# Patient Record
Sex: Male | Born: 1965 | Race: White | Hispanic: No | Marital: Married | Smoking: Never smoker
Health system: Southern US, Community
[De-identification: ages and names within clinical notes are randomized; demographics above are authoritative.]

## PROBLEM LIST (undated history)

## (undated) DIAGNOSIS — M1990 Unspecified osteoarthritis, unspecified site: Secondary | ICD-10-CM

## (undated) DIAGNOSIS — I10 Essential (primary) hypertension: Secondary | ICD-10-CM

## (undated) DIAGNOSIS — Z86010 Personal history of colonic polyps: Principal | ICD-10-CM

## (undated) DIAGNOSIS — K219 Gastro-esophageal reflux disease without esophagitis: Secondary | ICD-10-CM

## (undated) DIAGNOSIS — T7840XA Allergy, unspecified, initial encounter: Secondary | ICD-10-CM

## (undated) DIAGNOSIS — R011 Cardiac murmur, unspecified: Secondary | ICD-10-CM

## (undated) HISTORY — DX: Cardiac murmur, unspecified: R01.1

## (undated) HISTORY — PX: POLYPECTOMY: SHX149

## (undated) HISTORY — DX: Unspecified osteoarthritis, unspecified site: M19.90

## (undated) HISTORY — PX: COLONOSCOPY: SHX174

## (undated) HISTORY — DX: Personal history of colonic polyps: Z86.010

## (undated) HISTORY — DX: Essential (primary) hypertension: I10

## (undated) HISTORY — DX: Allergy, unspecified, initial encounter: T78.40XA

## (undated) HISTORY — DX: Gastro-esophageal reflux disease without esophagitis: K21.9

---

## 2007-10-12 ENCOUNTER — Ambulatory Visit (HOSPITAL_COMMUNITY): Admission: RE | Admit: 2007-10-12 | Discharge: 2007-10-12 | Payer: Self-pay | Admitting: Internal Medicine

## 2008-06-13 ENCOUNTER — Ambulatory Visit (HOSPITAL_COMMUNITY): Admission: RE | Admit: 2008-06-13 | Discharge: 2008-06-13 | Payer: Self-pay | Admitting: Internal Medicine

## 2008-06-21 ENCOUNTER — Ambulatory Visit (HOSPITAL_COMMUNITY): Admission: RE | Admit: 2008-06-21 | Discharge: 2008-06-21 | Payer: Self-pay | Admitting: Sports Medicine

## 2009-11-26 ENCOUNTER — Encounter: Payer: Self-pay | Admitting: Cardiovascular Disease

## 2009-12-14 ENCOUNTER — Ambulatory Visit: Payer: Self-pay | Admitting: Cardiovascular Disease

## 2009-12-14 DIAGNOSIS — R079 Chest pain, unspecified: Secondary | ICD-10-CM

## 2009-12-14 DIAGNOSIS — R011 Cardiac murmur, unspecified: Secondary | ICD-10-CM

## 2010-07-04 IMAGING — CR DG TIBIA/FIBULA 2V*L*
3 series · 3 of 3 positions shown · non-contrast
Comparison: No priors

CLINICAL DATA: Bilateral leg pain - the patient is a runner.

LEFT TIBIA AND FIBULA - 2 VIEW

[view not recorded (1 of 3)]
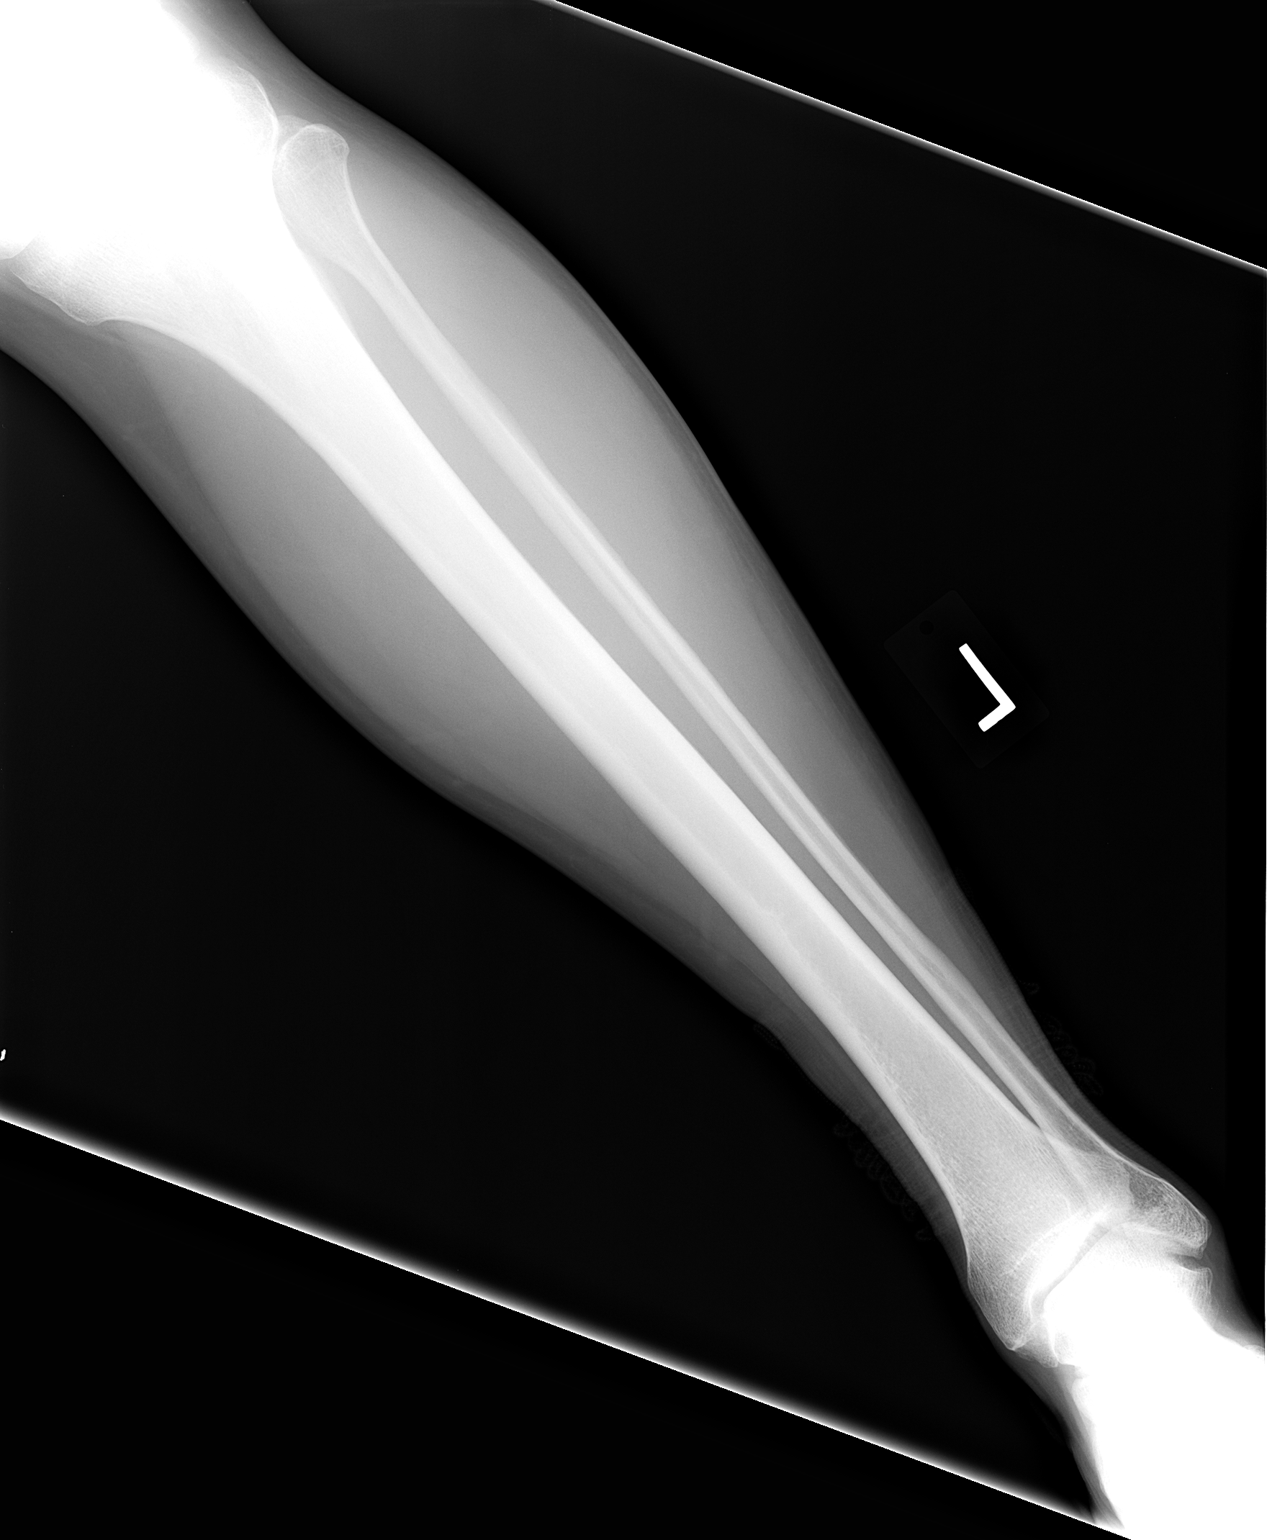

[view not recorded (2 of 3)]
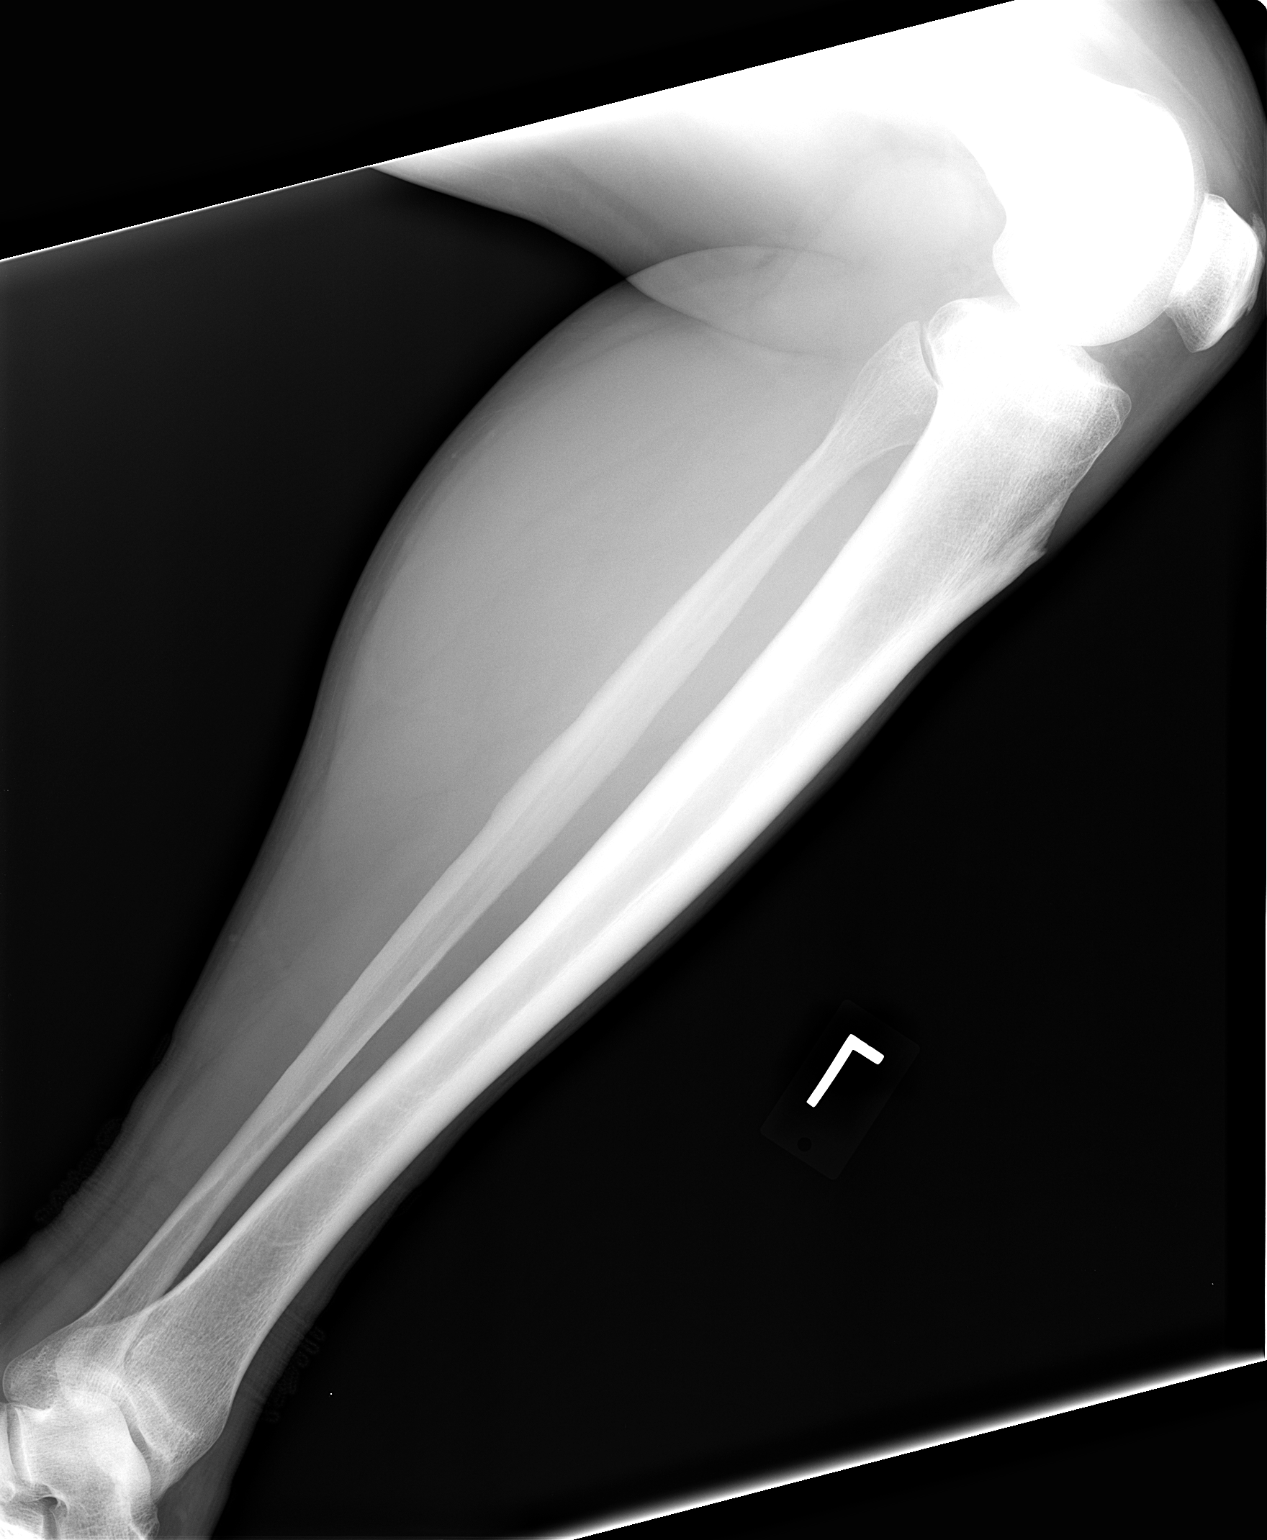

[view not recorded (3 of 3)]
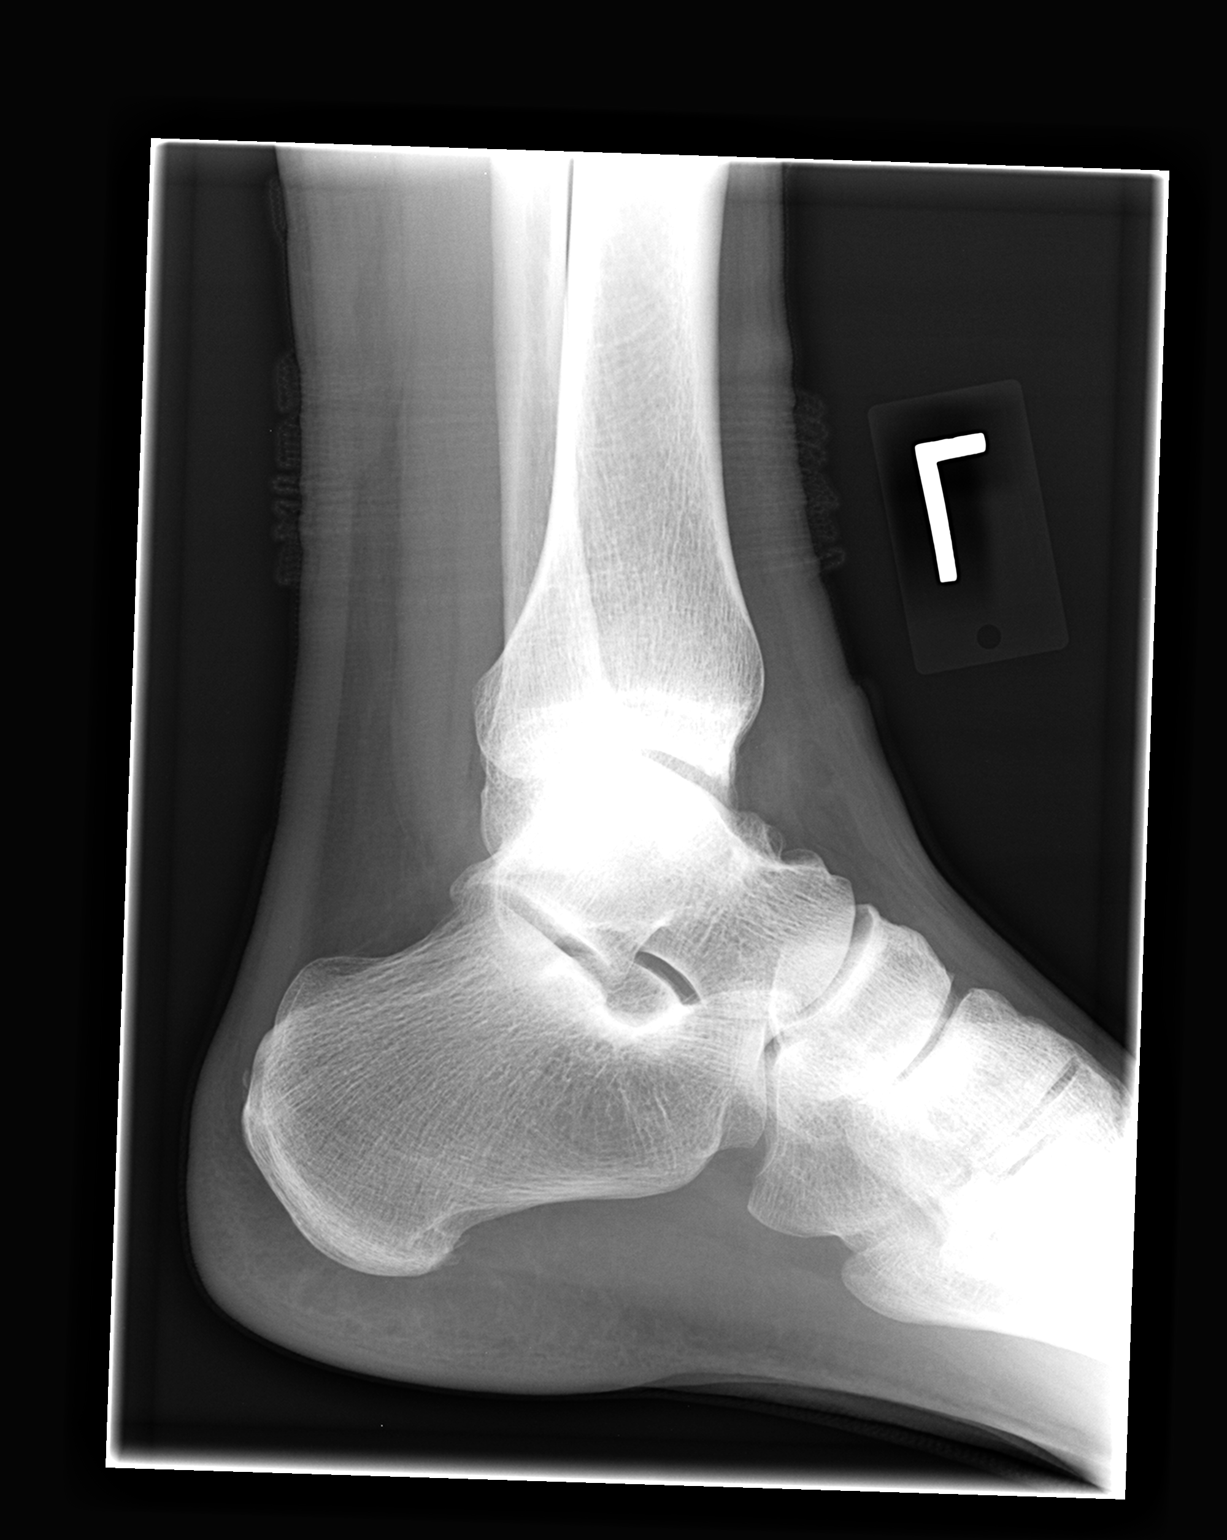

[3 of 3 positions shown; findings below may reference images not displayed]

FINDINGS: No visible fracture or stress fracture.  Soft tissues
unremarkable.
IMPRESSION: No acute or focal abnormality.

## 2010-07-12 IMAGING — NM NM BONE 3 PHASE
2 series · 12 of 12 positions shown · non-contrast
Comparison: Plain radiographs of the lower legs on 06/13/2008

CLINICAL DATA: Query bilateral stress fractures.  Shin splints.
Lower extremity pain.

NUCLEAR MEDICINE THREE PHASE BONE SCAN
TECHNIQUE: After intravenous administration of radiopharmeceutical,
immediate flow images were performed by immediate / early static
images and approximate three hour delayed static images.
Radiopharmaceutical: 25 mCi technetium MDP IV

[3 phase bone · 9.33mm/px · 6 of 48 frames shown (1 of 2)]
[frame 5/48]
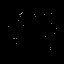
[frame 13/48]
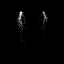
[frame 21/48  full-range]
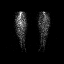
[frame 29/48  full-range]
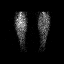
[frame 37/48  full-range]
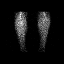
[frame 45/48  full-range]
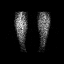

[3 phase bone · 9.33mm/px · 6 of 48 frames shown (2 of 2)]
[frame 5/48]
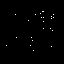
[frame 13/48]
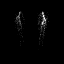
[frame 21/48  full-range]
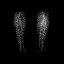
[frame 29/48  full-range]
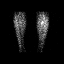
[frame 37/48  full-range]
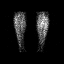
[frame 45/48  full-range]
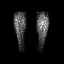

[12 of 12 positions shown; findings below may reference images not displayed]

FINDINGS: Bilateral increased peripheral tracer uptake involving
the tibia is somewhat more so involving the proximal aspects of the
tibias.  Findings are compatible with shin splints (periostitis).
IMPRESSION: Bilateral shin splints/tibial periostitis.

## 2010-08-06 NOTE — Progress Notes (Signed)
Summary: [facility] Adolescent Office Note  [facility] Adolescent Office Note   Imported By: Roderic Ovens 12/26/2009 14:51:45  _____________________________________________________________________  External Attachment:    Type:   Image     Comment:   External Document

## 2010-08-06 NOTE — Consult Note (Signed)
Summary: [facility]  [facility]   Imported By: Marylou Mccoy 12/12/2009 09:14:38  _____________________________________________________________________  External Attachment:    Type:   Image     Comment:   External Document

## 2010-08-06 NOTE — Assessment & Plan Note (Signed)
Summary: NP3/CHEST PAIN/JML   History of Present Illness: Duane Palmer is seen today at the request of Dr Elisabeth Most for atypical SSCP.  He was evaluated at the end of May with  normal ECG.  He has no previous diagnosis of CAD and has not had a previous ETT.  CRF's are negative except for family history.  His pain persists and is very atypical.  He points to multiple spots over the right and left chest, left shoulder,  and arm that have fleeting pain.  He indicated pain at the time of our visit.  He had been strength training up until about a month ago and taking some supplements including Creatine.  He denies steroid use.  The pains are not made worse with exercise.  He has had previous right rotator cuff injury but no other trauma, connective tissue disease or systemic disease.  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   Vital Signs:  Patient profile:   45 year old male Height:      69 inches Weight:      227 pounds BMI:     33.64 Pulse rate:   80 / minute Pulse rhythm:   regular BP sitting:   133 / 78  (left arm) Cuff size:   regular  Vitals Entered By: Judithe Modest CMA (December 14, 2009 3:07  PM)  Physical Exam  General:  Affect appropriate Healthy:  appears stated age HEENT: normal Neck supple with no adenopathy JVP normal no bruits no thyromegaly Lungs clear with no wheezing and good diaphragmatic motion Heart:  S1/S2 systolic murmur no rub, gallop or click PMI normal Abdomen: benighn, BS positve, no tenderness, no AAA no bruit.  No HSM or HJR Distal pulses intact with no bruits No edema Neuro non-focal Skin warm and dry    Impression & Recommendations:  Problem # 1:  CHEST PAIN UNSPECIFIED (ICD-786.50) Atypical normal ECG  F/U ETT Orders: Treadmill (Treadmill)  Problem # 2:  MURMUR (ICD-785.2) Assessment: Unchanged Benign systolic murmur.  Will do echo same day as ETT Orders: Echocardiogram (Echo)  Patient Instructions: 1)  Your physician recommends that you schedule a follow-up appointment in: AS NEEDED 2)  Your physician recommends that you continue on your current medications as directed. Please refer to the Current Medication list given to you today. 3)  Your physician has requested that you have an echocardiogram.  Echocardiography is a painless test that uses sound waves to create images of your heart. It provides your doctor with information about the size and shape of your heart and how well your heart's chambers and valves are working.  This procedure takes approximately one hour. There are no restrictions for this procedure.GXT SAME DAY 4)  Your physician has requested that you have an exercise tolerance test.  For further information please  visit [email or web address]  Please also follow instruction sheet, as given.   EKG Report  Procedure date:  11/26/2009  Findings:      NSR Normal ECG

## 2011-06-07 ENCOUNTER — Encounter: Payer: Self-pay | Admitting: *Deleted

## 2011-06-07 ENCOUNTER — Emergency Department (HOSPITAL_COMMUNITY)
Admission: EM | Admit: 2011-06-07 | Discharge: 2011-06-07 | Disposition: A | Discharge status: Home / Self Care | Payer: 59 | Attending: Emergency Medicine | Admitting: Emergency Medicine

## 2011-06-07 DIAGNOSIS — R55 Syncope and collapse: Secondary | ICD-10-CM | POA: Insufficient documentation

## 2011-06-07 DIAGNOSIS — E860 Dehydration: Secondary | ICD-10-CM | POA: Insufficient documentation

## 2011-06-07 LAB — BASIC METABOLIC PANEL
Calcium: 9.5 mg/dL (ref 8.4–10.5)
Chloride: 102 mEq/L (ref 96–112)
Creatinine, Ser: 1.11 mg/dL (ref 0.50–1.35)
GFR calc Af Amer: >90 mL/min (ref >90)

## 2011-06-07 LAB — GLUCOSE, CAPILLARY: Glucose-Capillary: 105 mg/dL — ABNORMAL HIGH (ref 70–99)

## 2011-06-07 LAB — CBC
MCV: 93.3 fL (ref 78.0–100.0)
Platelets: 193 10*3/uL (ref 150–400)
RDW: 13 % (ref 11.5–15.5)
WBC: 8.7 10*3/uL (ref 4.0–10.5)

## 2011-06-07 MED ORDER — IBUPROFEN 800 MG PO TABS
800.0000 mg | ORAL_TABLET | Freq: Once | ORAL | Status: AC
  Administered 2011-06-07: 800 mg via ORAL
  Filled 2011-06-07: qty 1

## 2011-06-07 MED ORDER — SODIUM CHLORIDE 0.9 % IV BOLUS (SEPSIS)
1000.0000 mL | Freq: Once | INTRAVENOUS | Status: AC
  Administered 2011-06-07: 1000 mL via INTRAVENOUS

## 2011-06-07 MED ORDER — ONDANSETRON HCL 4 MG/2ML IJ SOLN
4.0000 mg | Freq: Once | INTRAMUSCULAR | Status: AC
  Administered 2011-06-07: 4 mg via INTRAVENOUS
  Filled 2011-06-07: qty 2

## 2011-06-07 NOTE — ED Notes (Signed)
Pt was outside in [place] for about 3 hours today, started to feel weak and shaky, presents pale and shaky in triage

## 2011-06-07 NOTE — ED Notes (Signed)
Pt requesting Ginger Ale which was given, okayed by PA, will monitor.

## 2011-06-07 NOTE — ED Provider Notes (Signed)
History     CSN: [id number] Arrival date & time: 06/07/2011  2:52 PM   First MD Initiated Contact with Patient 06/07/11 1613      Chief Complaint  Patient presents with  . Near Syncope    (Consider location/radiation/quality/duration/timing/severity/associated sxs/prior treatment) HPI History provided by pt.   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  Shortly after, he became anxious.  Did not have lightheadedness, dizziness, vision changes, paresthesias, chest pain.  Has had similar sx in the past and attributes to dehydration. Pt had not had anything to eat/drink since 8:30am.  No recent illnesses.  No PMH.  No h/o anxiety.    History reviewed. No pertinent past medical history.  History reviewed. No pertinent past surgical history.  No family history on file.  History  Substance Use Topics  . Smoking status: Never Smoker   . Smokeless tobacco: Not on file  . Alcohol Use: Yes     daily      Review of Systems  All other systems reviewed and are negative.    Allergies  Review of patient's allergies indicates no known allergies.  Home Medications   Current Outpatient Rx  Name Route Sig Dispense Refill  . BISMUTH SUBSALICYLATE 262 MG/15ML PO SUSP Oral Take 30 mLs by mouth every 6 (six) hours as needed. Upset stomach     . THERA M PLUS PO TABS Oral Take 1 tablet by mouth daily.      Marland Kitchen NICOTINE POLACRILEX 2 MG MT GUM Oral Take 2 mg by mouth as needed. Chews 8 pieces of gum daily       BP 125/77  Temp 97.9 F (36.6 C)  Resp 31  SpO2 100%  Physical Exam  Nursing note and vitals reviewed. Constitutional: He is oriented to person, place, and time. He appears well-developed and well-nourished. No distress.  HENT:  Head: Normocephalic and atraumatic.       Face flushed. Mucous membranes dry.  Eyes:       Normal appearance  Neck: Normal range of motion.  Cardiovascular: Normal  rate and regular rhythm.   Pulmonary/Chest: Effort normal and breath sounds normal. He exhibits no tenderness.  Abdominal: Soft. Bowel sounds are normal. He exhibits no distension. There is no tenderness.  Musculoskeletal: He exhibits no edema and no tenderness.  Neurological: He is alert and oriented to person, place, and time.  Skin: Skin is warm and dry. No rash noted.  Psychiatric: He has a normal mood and affect. His behavior is normal.    ED Course  Procedures (including critical care time)   Date: 06/07/2011  Rate: 82  Rhythm: normal sinus rhythm  QRS Axis: normal  Intervals: normal  ST/T Wave abnormalities: nonspecific T wave changes  Conduction Disutrbances:none  Narrative Interpretation:   Old EKG Reviewed: none available   Labs Reviewed  BASIC METABOLIC PANEL - Abnormal; Notable for the following:    Glucose, Bld 121 (*)    GFR calc non Af Amer 79 (*)    All other components within normal limits  GLUCOSE, CAPILLARY - Abnormal; Notable for the following:    Glucose-Capillary 105 (*)    All other components within normal limits  CBC  POCT CBG MONITORING   No results found.   1. Dehydration       MDM  Pt believes that he is dehydrated.  Worked outside for several hrs today and had little to  eat/drink.  Had an episode of chills, clamminess, tremors, nausea, palpitations and RR.  Vomited twice in ED.  No h/o anxiety.  In triage, pale, shaky, tachypneic and HR 95bpm.  My exam sig for dry mucous membranes and elevated HR.  EKG and labs unremarkable.  Pt received 2L bolus and 800mg  ibuprofen and reports that he is feeling better and well enough to go home.  Has a PCP to f/u with.  Recommended rest and hydration.  Return precautions discussed.         Otilio Miu, [place] 06/07/11 2030

## 2011-06-07 NOTE — ED Notes (Signed)
WUJ:WJ19<JY> Expected date:06/07/11<BR> Expected time: 3:57 PM<BR> Means of arrival:Ambulance<BR> Comments:<BR> EMS 90 GC, 78 yom swelling to neck No airway compromise

## 2011-06-08 NOTE — ED Provider Notes (Signed)
Medical screening examination/treatment/procedure(s) were performed by non-physician practitioner and as supervising physician I was immediately available for consultation/collaboration.  Celene Kras, MD 06/08/11 [phone]

## 2013-12-22 ENCOUNTER — Ambulatory Visit: Payer: Self-pay | Admitting: Emergency Medicine

## 2016-11-26 ENCOUNTER — Telehealth: Payer: Self-pay

## 2016-11-26 ENCOUNTER — Encounter: Payer: Self-pay | Admitting: Internal Medicine

## 2016-11-26 NOTE — Telephone Encounter (Signed)
Left message for patient to call back  

## 2016-11-26 NOTE — Telephone Encounter (Signed)
-----   Message from Gatha Mayer, MD sent at 11/25/2016  4:53 PM EDT ----- Regarding: needs previsit and colonoscopy Goes by Duane Palmer [facility] [employer]  Asked me to arrange colonoscopy   Would you contact him and set up?  Screening  Thanks  Cell is [phone]

## 2016-11-28 NOTE — Telephone Encounter (Signed)
Left message for patient to call back  

## 2016-12-03 NOTE — Telephone Encounter (Signed)
No return call from the patient.  I will mail him a letter

## 2017-01-08 ENCOUNTER — Ambulatory Visit (AMBULATORY_SURGERY_CENTER): Payer: Self-pay

## 2017-01-08 VITALS — Ht 70.0 in | Wt 180.0 lb

## 2017-01-08 DIAGNOSIS — Z1211 Encounter for screening for malignant neoplasm of colon: Secondary | ICD-10-CM

## 2017-01-08 NOTE — Progress Notes (Signed)
No allergies to eggs or soy No diet meds No home oxygen No past problems with anesthesia  Declined emmi 

## 2017-01-12 ENCOUNTER — Encounter: Payer: Self-pay | Admitting: Internal Medicine

## 2017-01-21 ENCOUNTER — Ambulatory Visit (AMBULATORY_SURGERY_CENTER): Payer: 59 | Admitting: Internal Medicine

## 2017-01-21 ENCOUNTER — Encounter: Payer: Self-pay | Admitting: Internal Medicine

## 2017-01-21 VITALS — BP 114/75 | HR 46 | Temp 98.4°F | Resp 14 | Ht 70.0 in | Wt 180.0 lb

## 2017-01-21 DIAGNOSIS — Z1212 Encounter for screening for malignant neoplasm of rectum: Secondary | ICD-10-CM | POA: Diagnosis not present

## 2017-01-21 DIAGNOSIS — Z1211 Encounter for screening for malignant neoplasm of colon: Secondary | ICD-10-CM | POA: Diagnosis present

## 2017-01-21 DIAGNOSIS — D123 Benign neoplasm of transverse colon: Secondary | ICD-10-CM | POA: Diagnosis not present

## 2017-01-21 DIAGNOSIS — D124 Benign neoplasm of descending colon: Secondary | ICD-10-CM

## 2017-01-21 MED ORDER — SODIUM CHLORIDE 0.9 % IV SOLN: 500.0000 mL | INTRAVENOUS | Status: AC

## 2017-01-21 NOTE — Progress Notes (Signed)
A/ox3 pleased with MAC, report to [facility]

## 2017-01-21 NOTE — Op Note (Signed)
[place] Patient Name: Duane Palmer Procedure Date: 01/21/2017 11:27 AM MRN: [id number] Endoscopist: Gatha Mayer , MD Age: 51 Referring MD:  Date of Birth: 05-20-1966 Gender: Male Account #: [id number] Procedure:                Colonoscopy Indications:              Screening for colorectal malignant neoplasm, This                            is the patient's first colonoscopy Medicines:                Monitored Anesthesia Care Procedure:                Pre-Anesthesia Assessment:                           - Prior to the procedure, a History and Physical                            was performed, and patient medications and                            allergies were reviewed. The patient's tolerance of                            previous anesthesia was also reviewed. The risks                            and benefits of the procedure and the sedation                            options and risks were discussed with the patient.                            All questions were answered, and informed consent                            was obtained. Prior Anticoagulants: The patient has                            taken no previous anticoagulant or antiplatelet                            agents. ASA Grade Assessment: II - A patient with                            mild systemic disease. After reviewing the risks                            and benefits, the patient was deemed in                            satisfactory condition to undergo the procedure.  After obtaining informed consent, the colonoscope                            was passed under direct vision. Throughout the                            procedure, the patient's blood pressure, pulse, and                            oxygen saturations were monitored continuously. The                            Colonoscope was introduced through the anus and                            advanced to the the cecum,  identified by                            appendiceal orifice and ileocecal valve. The                            colonoscopy was performed without difficulty. The                            patient tolerated the procedure well. The quality                            of the bowel preparation was excellent. The                            ileocecal valve, appendiceal orifice, and rectum                            were photographed. The bowel preparation used was                            Miralax. Scope In: 11:36:04 AM Scope Out: 12:01:42 PM Scope Withdrawal Time: 0 hours 23 minutes 8 seconds  Total Procedure Duration: 0 hours 25 minutes 38 seconds  Findings:                 The perianal and digital rectal examinations were                            normal. Pertinent negatives include normal prostate                            (size, shape, and consistency).                           Four sessile polyps were found in the descending                            colon and transverse colon. The polyps were  diminutive in size. These polyps were removed with                            a cold snare. Resection and retrieval were                            complete. Verification of patient identification                            for the specimen was done. Estimated blood loss was                            minimal.                           A diffuse area of severe melanosis was found in the                            entire colon.                           The exam was otherwise without abnormality on                            direct and retroflexion views. Complications:            No immediate complications. Estimated Blood Loss:     Estimated blood loss was minimal. Impression:               - Four diminutive polyps in the descending colon                            and in the transverse colon, removed with a cold                            snare. Resected and  retrieved.                           - Melanosis in the colon.                           - The examination was otherwise normal on direct                            and retroflexion views. Recommendation:           - Patient has a contact number available for                            emergencies. The signs and symptoms of potential                            delayed complications were discussed with the                            patient. Return to normal activities tomorrow.  Written discharge instructions were provided to the                            patient.                           - Resume previous diet.                           - Continue present medications.                           - Repeat colonoscopy is recommended. The                            colonoscopy date will be determined after pathology                            results from today's exam become available for                            review. Gatha Mayer, MD 01/21/2017 12:09:10 PM This report has been signed electronically.

## 2017-01-21 NOTE — Progress Notes (Signed)
Called to room to assist during endoscopic procedure.  Patient ID and intended procedure confirmed with present staff. Received instructions for my participation in the procedure from the performing physician.  

## 2017-01-21 NOTE — Patient Instructions (Addendum)
I found and removed 4 diminutive polyps - all look benign.  I will let you know pathology results and when to have another routine colonoscopy by mail and/or My Chart.  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 Not a problem.  I appreciate the opportunity to care for you. Gatha Mayer, MD, FACG     YOU HAD AN ENDOSCOPIC PROCEDURE TODAY AT [facility]:   Refer to the procedure report that was given to you for any specific questions about what was found during the examination.  If the procedure report does not answer your questions, please call your gastroenterologist to clarify.  If you requested that your care partner not be given the details of your procedure findings, then the procedure report has been included in a sealed envelope for you to review at your convenience later.  YOU SHOULD EXPECT: Some feelings of bloating in the abdomen. Passage of more gas than usual.  Walking can help get rid of the air that was put into your GI tract during the procedure and reduce the bloating. If you had a lower endoscopy (such as a colonoscopy or flexible sigmoidoscopy) you may notice spotting of blood in your stool or on the toilet paper. If you underwent a bowel prep for your procedure, you may not have a normal bowel movement for a few days.  Please Note:  You might notice some irritation and congestion in your nose or some drainage.  This is from the oxygen used during your procedure.  There is no need for concern and it should clear up in a day or so.  SYMPTOMS TO REPORT IMMEDIATELY:   Following lower endoscopy (colonoscopy or flexible sigmoidoscopy):  Excessive amounts of blood in the stool  Significant tenderness or worsening of abdominal pains  Swelling of the abdomen that is new, acute  Fever of 100F or higher   For urgent or emergent issues, a gastroenterologist can be reached at any hour by calling [phone].   DIET:  We do  recommend a small meal at first, but then you may proceed to your regular diet.  Drink plenty of fluids but you should avoid alcoholic beverages for 24 hours.  ACTIVITY:  You should plan to take it easy for the rest of today and you should NOT DRIVE or use heavy machinery until tomorrow (because of the sedation medicines used during the test).    FOLLOW UP: Our staff will call the number listed on your records the next business day following your procedure to check on you and address any questions or concerns that you may have regarding the information given to you following your procedure. If we do not reach you, we will leave a message.  However, if you are feeling well and you are not experiencing any problems, there is no need to return our call.  We will assume that you have returned to your regular daily activities without incident.  If any biopsies were taken you will be contacted by phone or by letter within the next 1-3 weeks.  Please call us at [phone] if you have not heard about the biopsies in 3 weeks.    SIGNATURES/CONFIDENTIALITY: You and/or your care partner have signed paperwork which will be entered into your electronic medical record.  These signatures attest to the fact that that the information above on your After Visit Summary has been reviewed and is understood.  Full responsibility of the confidentiality  of this discharge information lies with you and/or your care-partner.   Resume medications. Information given on polyps.

## 2017-01-22 ENCOUNTER — Telehealth: Payer: Self-pay

## 2017-01-22 ENCOUNTER — Telehealth: Payer: Self-pay | Admitting: *Deleted

## 2017-01-22 NOTE — Telephone Encounter (Signed)
  Follow up Call-  Call back number 01/21/2017  Post procedure Call Back phone  # [phone]  Permission to leave phone message Yes  Some recent data might be hidden    Spoke with wife Patient questions:  Do you have a fever, pain , or abdominal swelling? No. Pain Score  0 *  Have you tolerated food without any problems? Yes.    Have you been able to return to your normal activities? Yes.    Do you have any questions about your discharge instructions: Diet   No. Medications  No. Follow up visit  No.  Do you have questions or concerns about your Care? No.  Actions: * If pain score is 4 or above: No action needed, pain <4.

## 2017-01-22 NOTE — Telephone Encounter (Signed)
Follow up call unable to go through.  Unable to LM.  Will try to reach pt. Later today.

## 2017-01-28 ENCOUNTER — Encounter: Payer: Self-pay | Admitting: Internal Medicine

## 2017-01-28 DIAGNOSIS — Z860101 Personal history of adenomatous and serrated colon polyps: Secondary | ICD-10-CM | POA: Insufficient documentation

## 2017-01-28 DIAGNOSIS — Z86010 Personal history of colonic polyps: Secondary | ICD-10-CM

## 2017-01-28 HISTORY — DX: Personal history of colonic polyps: Z86.010

## 2017-01-28 NOTE — Progress Notes (Signed)
4 diminutive adenomas Recall 2021

## 2020-03-22 ENCOUNTER — Other Ambulatory Visit: Payer: Self-pay

## 2020-04-25 ENCOUNTER — Encounter: Payer: 59 | Admitting: Internal Medicine

## 2020-05-28 ENCOUNTER — Encounter: Payer: Self-pay | Admitting: Internal Medicine

## 2020-05-28 ENCOUNTER — Other Ambulatory Visit: Payer: Self-pay

## 2020-05-28 ENCOUNTER — Ambulatory Visit (AMBULATORY_SURGERY_CENTER): Payer: 59 | Admitting: *Deleted

## 2020-05-28 VITALS — Ht 69.0 in | Wt 190.0 lb

## 2020-05-28 DIAGNOSIS — Z86010 Personal history of colonic polyps: Secondary | ICD-10-CM

## 2020-05-28 NOTE — Progress Notes (Signed)
Pt verified name, DOB, address and insurance during PV today. Pt mailed instruction packet to included paper to complete and mail back to [facility] with addressed and stamped envelope, Emmi video, copy of consent form to read and not return, and instructions.. PV completed over the phone. Pt encouraged to call with questions or issues   Fully vax'd  No egg or soy allergy known to patient  No issues with past sedation with any surgeries or procedures No past  intubation problems   No FH of Malignant Hyperthermia + diet pills per patient- takes phentermine- instructed off x 10 days starting 11-22 Monday  No home 02 use per patient  No blood thinners per patient  Pt denies issues with constipation  No A fib or A flutter  EMMI video to pt or via [place] 19 guidelines implemented in PV today with Pt and RN    Due to the COVID-19 pandemic we are asking patients to follow these guidelines. Please only bring one care partner. Please be aware that your care partner may wait in the car in the parking lot or if they feel like they will be too hot to wait in the car, they may wait in the lobby on the 4th floor. All care partners are required to wear a mask the entire time (we do not have any that we can provide them), they need to practice social distancing, and we will do a Covid check for all patient's and care partners when you arrive. Also we will check their temperature and your temperature. If the care partner waits in their car they need to stay in the parking lot the entire time and we will call them on their cell phone when the patient is ready for discharge so they can bring the car to the front of the building. Also all patient's will need to wear a mask into building.

## 2020-06-07 ENCOUNTER — Ambulatory Visit: Payer: 59 | Admitting: Internal Medicine

## 2020-06-07 ENCOUNTER — Encounter: Payer: Self-pay | Admitting: Internal Medicine

## 2020-06-07 ENCOUNTER — Other Ambulatory Visit: Payer: Self-pay

## 2020-06-07 VITALS — BP 91/48 | HR 56 | Temp 96.9°F | Resp 16 | Ht 70.0 in | Wt 189.0 lb

## 2020-06-07 DIAGNOSIS — Z86010 Personal history of colonic polyps: Secondary | ICD-10-CM

## 2020-06-07 DIAGNOSIS — D123 Benign neoplasm of transverse colon: Secondary | ICD-10-CM

## 2020-06-07 DIAGNOSIS — K6389 Other specified diseases of intestine: Secondary | ICD-10-CM

## 2020-06-07 MED ORDER — SODIUM CHLORIDE 0.9 % IV SOLN: 500.0000 mL | Freq: Once | INTRAVENOUS | Status: DC

## 2020-06-07 NOTE — Progress Notes (Signed)
Called to room to assist during endoscopic procedure.  Patient ID and intended procedure confirmed with present staff. Received instructions for my participation in the procedure from the performing physician.  

## 2020-06-07 NOTE — Op Note (Signed)
[place] Patient Name: Emmitte Surgeon Procedure Date: 06/07/2020 3:26 PM MRN: [id number] Endoscopist: Gatha Mayer , MD Age: 54 Referring MD:  Date of Birth: October 16, 1965 Gender: Male Account #: [id number] Procedure:                Colonoscopy Indications:              Surveillance: Personal history of adenomatous                            polyps on last colonoscopy 3 years ago Medicines:                Propofol per Anesthesia, Monitored Anesthesia Care Procedure:                Pre-Anesthesia Assessment:                           - Prior to the procedure, a History and Physical                            was performed, and patient medications and                            allergies were reviewed. The patient's tolerance of                            previous anesthesia was also reviewed. The risks                            and benefits of the procedure and the sedation                            options and risks were discussed with the patient.                            All questions were answered, and informed consent                            was obtained. Prior Anticoagulants: The patient has                            taken no previous anticoagulant or antiplatelet                            agents. ASA Grade Assessment: II - A patient with                            mild systemic disease. After reviewing the risks                            and benefits, the patient was deemed in                            satisfactory condition to undergo the procedure.  After obtaining informed consent, the colonoscope                            was passed under direct vision. Throughout the                            procedure, the patient's blood pressure, pulse, and                            oxygen saturations were monitored continuously. The                            Colonoscope was introduced through the anus and                             advanced to the the cecum, identified by                            appendiceal orifice and ileocecal valve. The                            colonoscopy was somewhat difficult due to                            significant looping. Successful completion of the                            procedure was aided by applying abdominal pressure.                            The patient tolerated the procedure well. The                            quality of the bowel preparation was good. The                            bowel preparation used was Miralax via split dose                            instruction. The ileocecal valve, appendiceal                            orifice, and rectum were photographed. Scope In: 3:38:41 PM Scope Out: 3:59:56 PM Scope Withdrawal Time: 0 hours 17 minutes 24 seconds  Total Procedure Duration: 0 hours 21 minutes 15 seconds  Findings:                 The perianal and digital rectal examinations were                            normal. Pertinent negatives include normal prostate                            (size, shape, and consistency).  A 1 to 2 mm polyp was found in the transverse                            colon. The polyp was sessile. The polyp was removed                            with a cold biopsy forceps. Resection and retrieval                            were complete. Verification of patient                            identification for the specimen was done. Estimated                            blood loss was minimal.                           A patchy area of mildly melanotic mucosa was found                            in the entire colon.                           The exam was otherwise without abnormality on                            direct and retroflexion views. Complications:            No immediate complications. Estimated Blood Loss:     Estimated blood loss was minimal. Impression:               - One 1 to 2 mm polyp in the  transverse colon,                            removed with a cold biopsy forceps. Resected and                            retrieved.                           - Melanotic mucosa in the entire examined colon.                           - The examination was otherwise normal on direct                            and retroflexion views.                           - Personal history of colonic polyps. 4 diminutive                            adenomas 2018 Recommendation:           - Patient  has a contact number available for                            emergencies. The signs and symptoms of potential                            delayed complications were discussed with the                            patient. Return to normal activities tomorrow.                            Written discharge instructions were provided to the                            patient.                           - Continue present medications.                           - Repeat colonoscopy is recommended for                            surveillance. The colonoscopy date will be                            determined after pathology results from today's                            exam become available for review.                           - Resume previous diet. Gatha Mayer, MD 06/07/2020 4:08:00 PM This report has been signed electronically.

## 2020-06-07 NOTE — Patient Instructions (Signed)
YOU HAD AN ENDOSCOPIC PROCEDURE TODAY AT THE [facility]:   Refer to the procedure report that was given to you for any specific questions about what was found during the examination.  If the procedure report does not answer your questions, please call your gastroenterologist to clarify.  If you requested that your care partner not be given the details of your procedure findings, then the procedure report has been included in a sealed envelope for you to review at your convenience later.  YOU SHOULD EXPECT: Some feelings of bloating in the abdomen. Passage of more gas than usual.  Walking can help get rid of the air that was put into your GI tract during the procedure and reduce the bloating. If you had a lower endoscopy (such as a colonoscopy or flexible sigmoidoscopy) you may notice spotting of blood in your stool or on the toilet paper. If you underwent a bowel prep for your procedure, you may not have a normal bowel movement for a few days.  Please Note:  You might notice some irritation and congestion in your nose or some drainage.  This is from the oxygen used during your procedure.  There is no need for concern and it should clear up in a day or so.  SYMPTOMS TO REPORT IMMEDIATELY:   Following lower endoscopy (colonoscopy or flexible sigmoidoscopy):  Excessive amounts of blood in the stool  Significant tenderness or worsening of abdominal pains  Swelling of the abdomen that is new, acute  Fever of 100F or higher  For urgent or emergent issues, a gastroenterologist can be reached at any hour by calling [phone]. Do not use MyChart messaging for urgent concerns.    DIET:  We do recommend a small meal at first, but then you may proceed to your regular diet.  Drink plenty of fluids but you should avoid alcoholic beverages for 24 hours.  ACTIVITY:  You should plan to take it easy for the rest of today and you should NOT DRIVE or use heavy machinery until tomorrow (because  of the sedation medicines used during the test).    FOLLOW UP: Our staff will call the number listed on your records 48-72 hours following your procedure to check on you and address any questions or concerns that you may have regarding the information given to you following your procedure. If we do not reach you, we will leave a message.  We will attempt to reach you two times.  During this call, we will ask if you have developed any symptoms of COVID 19. If you develop any symptoms (ie: fever, flu-like symptoms, shortness of breath, cough etc.) before then, please call [phone].  If you test positive for Covid 19 in the 2 weeks post procedure, please call and report this information to us.    If any biopsies were taken you will be contacted by phone or by letter within the next 1-3 weeks.  Please call us at [phone] if you have not heard about the biopsies in 3 weeks.    SIGNATURES/CONFIDENTIALITY: You and/or your care partner have signed paperwork which will be entered into your electronic medical record.  These signatures attest to the fact that that the information above on your After Visit Summary has been reviewed and is understood.  Full responsibility of the confidentiality of this discharge information lies with you and/or your care-partner. 

## 2020-06-07 NOTE — Progress Notes (Signed)
A/ox3, pleased with MAC, report to RN 

## 2020-06-11 ENCOUNTER — Telehealth: Payer: Self-pay | Admitting: *Deleted

## 2020-06-11 NOTE — Telephone Encounter (Signed)
  Follow up Call-  Call back number 06/07/2020  Post procedure Call Back phone  # [phone]  Permission to leave phone message Yes  Some recent data might be hidden     Patient questions:  Do you have a fever, pain , or abdominal swelling? No. Pain Score  0 *  Have you tolerated food without any problems? Yes.    Have you been able to return to your normal activities? Yes.    Do you have any questions about your discharge instructions: Diet   No. Medications  No. Follow up visit  No.  Do you have questions or concerns about your Care? No.  Actions: * If pain score is 4 or above: No action needed, pain <4.  1. Have you developed a fever since your procedure? no  2.   Have you had an respiratory symptoms (SOB or cough) since your procedure? no  3.   Have you tested positive for COVID 19 since your procedure no  4.   Have you had any family members/close contacts diagnosed with the COVID 19 since your procedure?  no   If yes to any of these questions please route to Joylene John, RN and Joella Prince, RN

## 2020-06-18 ENCOUNTER — Encounter: Payer: Self-pay | Admitting: Internal Medicine

## 2020-06-18 DIAGNOSIS — Z86010 Personal history of colonic polyps: Secondary | ICD-10-CM

## 2022-04-09 ENCOUNTER — Other Ambulatory Visit: Payer: Self-pay

## 2022-04-09 ENCOUNTER — Other Ambulatory Visit (HOSPITAL_BASED_OUTPATIENT_CLINIC_OR_DEPARTMENT_OTHER): Payer: Self-pay

## 2022-04-09 DIAGNOSIS — E7800 Pure hypercholesterolemia, unspecified: Secondary | ICD-10-CM

## 2022-04-18 ENCOUNTER — Ambulatory Visit (HOSPITAL_COMMUNITY)
Admission: RE | Admit: 2022-04-18 | Discharge: 2022-04-18 | Disposition: A | Discharge status: Home / Self Care | Payer: 59 | Source: Ambulatory Visit

## 2022-04-18 DIAGNOSIS — E7800 Pure hypercholesterolemia, unspecified: Secondary | ICD-10-CM | POA: Insufficient documentation

## 2022-08-21 ENCOUNTER — Other Ambulatory Visit: Payer: Self-pay

## 2022-08-21 ENCOUNTER — Emergency Department (HOSPITAL_COMMUNITY): Payer: 59

## 2022-08-21 ENCOUNTER — Emergency Department (HOSPITAL_COMMUNITY)
Admission: EM | Admit: 2022-08-21 | Discharge: 2022-08-21 | Disposition: A | Discharge status: Home / Self Care | Payer: 59 | Attending: Emergency Medicine | Admitting: Emergency Medicine

## 2022-08-21 DIAGNOSIS — R252 Cramp and spasm: Secondary | ICD-10-CM | POA: Diagnosis present

## 2022-08-21 DIAGNOSIS — Z79899 Other long term (current) drug therapy: Secondary | ICD-10-CM | POA: Insufficient documentation

## 2022-08-21 DIAGNOSIS — I10 Essential (primary) hypertension: Secondary | ICD-10-CM | POA: Diagnosis not present

## 2022-08-21 DIAGNOSIS — R5383 Other fatigue: Secondary | ICD-10-CM | POA: Diagnosis not present

## 2022-08-21 DIAGNOSIS — R251 Tremor, unspecified: Secondary | ICD-10-CM | POA: Diagnosis not present

## 2022-08-21 LAB — ETHANOL: Alcohol, Ethyl (B): 82 mg/dL — ABNORMAL HIGH (ref <10)

## 2022-08-21 LAB — CBC WITH DIFFERENTIAL/PLATELET
Abs Immature Granulocytes: 0.02 10*3/uL (ref 0.00–0.07)
Basophils Absolute: 0 10*3/uL (ref 0.0–0.1)
Basophils Relative: 1 %
Eosinophils Absolute: 0.1 10*3/uL (ref 0.0–0.5)
Eosinophils Relative: 1 %
HCT: 40.7 % (ref 39.0–52.0)
Hemoglobin: 13.5 g/dL (ref 13.0–17.0)
Immature Granulocytes: 0 %
Lymphocytes Relative: 44 %
Lymphs Abs: 2.5 10*3/uL (ref 0.7–4.0)
MCH: 31.7 pg (ref 26.0–34.0)
MCHC: 33.2 g/dL (ref 30.0–36.0)
MCV: 95.5 fL (ref 80.0–100.0)
Monocytes Absolute: 0.4 10*3/uL (ref 0.1–1.0)
Monocytes Relative: 8 %
Neutro Abs: 2.6 10*3/uL (ref 1.7–7.7)
Neutrophils Relative %: 46 %
Platelets: 214 10*3/uL (ref 150–400)
RBC: 4.26 MIL/uL (ref 4.22–5.81)
RDW: 12.4 % (ref 11.5–15.5)
WBC: 5.7 10*3/uL (ref 4.0–10.5)
nRBC: 0 % (ref 0.0–0.2)

## 2022-08-21 LAB — RAPID URINE DRUG SCREEN, HOSP PERFORMED
Amphetamines: NOT DETECTED
Barbiturates: NOT DETECTED
Benzodiazepines: NOT DETECTED
Cocaine: NOT DETECTED
Opiates: NOT DETECTED
Tetrahydrocannabinol: NOT DETECTED

## 2022-08-21 LAB — COMPREHENSIVE METABOLIC PANEL
ALT: 22 U/L (ref 0–44)
AST: 19 U/L (ref 15–41)
Albumin: 4.2 g/dL (ref 3.5–5.0)
Alkaline Phosphatase: 69 U/L (ref 38–126)
Anion gap: 15 (ref 5–15)
BUN: 11 mg/dL (ref 6–20)
CO2: 23 mmol/L (ref 22–32)
Calcium: 9.3 mg/dL (ref 8.9–10.3)
Chloride: 101 mmol/L (ref 98–111)
Creatinine, Ser: 1.03 mg/dL (ref 0.61–1.24)
GFR, Estimated: >60 mL/min (ref >60)
Glucose, Bld: 146 mg/dL — ABNORMAL HIGH (ref 70–99)
Potassium: 3.7 mmol/L (ref 3.5–5.1)
Sodium: 139 mmol/L (ref 135–145)
Total Bilirubin: 0.5 mg/dL (ref 0.3–1.2)
Total Protein: 7.7 g/dL (ref 6.5–8.1)

## 2022-08-21 LAB — URINALYSIS, ROUTINE W REFLEX MICROSCOPIC
Bilirubin Urine: NEGATIVE
Glucose, UA: NEGATIVE mg/dL
Hgb urine dipstick: NEGATIVE
Ketones, ur: NEGATIVE mg/dL
Leukocytes,Ua: NEGATIVE
Nitrite: NEGATIVE
Protein, ur: NEGATIVE mg/dL
Specific Gravity, Urine: 1.005 (ref 1.005–1.030)
pH: 6 (ref 5.0–8.0)

## 2022-08-21 LAB — MAGNESIUM: Magnesium: 2.1 mg/dL (ref 1.7–2.4)

## 2022-08-21 LAB — TSH: TSH: 2.359 u[IU]/mL (ref 0.350–4.500)

## 2022-08-21 MED ORDER — DIAZEPAM 5 MG PO TABS: 5.0000 mg | ORAL_TABLET | Freq: Two times a day (BID) | ORAL | 0 refills | Status: AC

## 2022-08-21 MED ORDER — DIAZEPAM 5 MG PO TABS
5.0000 mg | ORAL_TABLET | Freq: Once | ORAL | Status: AC
  Administered 2022-08-21: 5 mg via ORAL
  Filled 2022-08-21: qty 1

## 2022-08-21 NOTE — ED Provider Triage Note (Signed)
Emergency Medicine Provider Triage Evaluation Note  Duane Palmer , a 57 y.o. male  was evaluated in triage.  Pt complains of anxiety and full body twitching. He reports that he has been under increased stress at work for the past week. He reports that he went to his PCP about it and was put on Buspar. He has increased his dose to TID per the recommendation of his PCP after telling her he didn't think it was worsening. The wife reports that he would stay up all night shaking with fear. He was at the grocery store and reports that he felt the physical manifestations of anxiety, just not the mental ones. He reports he just felt "out of it". His wife reports that he was having odd twitching and tremors today that was severe. No LOC. Denies any visual changes.  Review of Systems  Positive:  Negative:   Physical Exam  BP 120/68   Pulse 77   Temp 98.8 F (37.1 C)   Resp 20   Wt 85 kg   SpO2 95%   BMI 26.89 kg/m  Gen:   Awake, no distress   Resp:  Normal effort  MSK:   Moves extremities without difficulty  Other:  Cranial nerves II-XII intact. No pronator drift. Sensation intact. Strength intact. Finger nose finger intact. PERRL. EOMI. Occasional full body twitches present.   Medical Decision Making  Medically screening exam initiated at 6:02 PM.  Appropriate orders placed.  Carolyne Littles was informed that the remainder of the evaluation will be completed by another provider, this initial triage assessment does not replace that evaluation, and the importance of remaining in the ED until their evaluation is complete.  Discussed this case with my attending. Orders placed based on our discussion.    Sherrell Puller, [place] 08/21/22 [id number]

## 2022-08-21 NOTE — ED Triage Notes (Addendum)
Anxiety with tremors noted x1 week went to pcp and started new medication w/ no improvement.  Pt reports feeling fatigue, feeling like moving in slow motion.  Increased stress from work 1 week ago.  Denies cp, sob

## 2022-08-21 NOTE — Discharge Instructions (Signed)
Workup today was overall very reassuring.  Your blood work looked normal, the CT head was also negative for any acute process.  Call your primary tomorrow with no you are seen in the ED, ideally could be seen tomorrow but if not on Monday for reevaluation.  Take the Valium as needed for severe spasm or panic.  Continue taking the buspirone as prescribed until told otherwise by your primary.  Return to the ED if you have seizures, fevers, loss of vision, lateralized weakness or new or concerning symptoms.

## 2022-08-21 NOTE — ED Provider Notes (Signed)
[place] [facility] Provider Note   CSN: [id number] Arrival date & time: 08/21/22  1724     History  Chief Complaint  Patient presents with   Anxiety    Duane Palmer is a 57 y.o. male.   Anxiety     This is a patient with medical hypertension presenting to the emergency department due to shaking/anxiety.  Patient states last week on Thursday he got upsetting his phone, he has been having cyclical thoughts, shaking, significant stress since then.  Seen by his primary on Friday, started on medicine for anxiety buspirone 10 mg 2 times daily up to 3 times daily as needed for stress.  He has been having feelings like he is moving slow, twitching, and difficulty walking secondary to feeling like "the world is closing in on me".  Denies any SI or HI.  Had 1 beer prior to arrival, no history of alcohol withdrawal or abuse.  He is not on any other medications, does not smoke cigarettes, no illicit drug use.  Denies any history of psychiatric illness, no other medication changes.  Home Medications Prior to Admission medications   Medication Sig Start Date End Date Taking? Authorizing Provider  diazepam (VALIUM) 5 MG tablet Take 1 tablet (5 mg total) by mouth 2 (two) times daily. 08/21/22  Yes Sherrill Raring, PA-C  bismuth subsalicylate (PEPTO BISMOL) 262 MG/15ML suspension Take 30 mLs by mouth every 6 (six) hours as needed. Upset stomach  Patient not taking: Reported on 05/28/2020    [provider]  losartan-hydrochlorothiazide (HYZAAR) 100-12.5 MG tablet Take 1 tablet by mouth daily. 05/10/20   [provider]  nicotine polacrilex (NICORETTE) 2 MG gum Take 2 mg by mouth as needed. Chews 8 pieces of gum daily     [provider]  phentermine 37.5 MG capsule Take 37.5 mg by mouth every morning. Patient not taking: Reported on 05/28/2020    [provider]      Allergies    Patient has no known allergies.    Review of  Systems   Review of Systems  Physical Exam Updated Vital Signs BP 110/65   Pulse 63   Temp 98.6 F (37 C) (Oral)   Resp 17   Wt 85 kg   SpO2 96%   BMI 26.89 kg/m  Physical Exam Vitals and nursing note reviewed. Exam conducted with a chaperone present.  Constitutional:      Appearance: Normal appearance.  HENT:     Head: Normocephalic and atraumatic.  Eyes:     General: No scleral icterus.       Right eye: No discharge.        Left eye: No discharge.     Extraocular Movements: Extraocular movements intact.     Pupils: Pupils are equal, round, and reactive to light.  Cardiovascular:     Rate and Rhythm: Normal rate and regular rhythm.     Pulses: Normal pulses.     Heart sounds: Normal heart sounds. No murmur heard.    No friction rub. No gallop.  Pulmonary:     Effort: Pulmonary effort is normal. No respiratory distress.     Breath sounds: Normal breath sounds.  Abdominal:     General: Abdomen is flat. Bowel sounds are normal. There is no distension.     Palpations: Abdomen is soft.     Tenderness: There is no abdominal tenderness.  Skin:    General: Skin is warm and dry.  Coloration: Skin is not jaundiced.  Neurological:     Mental Status: He is alert. Mental status is at baseline.     Coordination: Coordination normal.     Comments: No dysarthria, cranial nerves II through XII are grossly intact.  Upper and lower extremity strength is symmetric bilaterally, able to ambulate across the room with steady gait.  He does have an occasional spasm, no obvious, no clonic activity and he is oriented x 3.  Psychiatric:     Comments: Anxious     ED Results / Procedures / Treatments   Labs (all labs ordered are listed, but only abnormal results are displayed) Labs Reviewed  COMPREHENSIVE METABOLIC PANEL - Abnormal; Notable for the following components:      Result Value   Glucose, Bld 146 (*)    All other components within normal limits  ETHANOL - Abnormal; Notable  for the following components:   Alcohol, Ethyl (B) 82 (*)    All other components within normal limits  URINALYSIS, ROUTINE W REFLEX MICROSCOPIC - Abnormal; Notable for the following components:   Color, Urine STRAW (*)    All other components within normal limits  CBC WITH DIFFERENTIAL/PLATELET  TSH  RAPID URINE DRUG SCREEN, HOSP PERFORMED  MAGNESIUM  T4, FREE    EKG None  Radiology CT Head Wo Contrast  Result Date: 08/21/2022 CLINICAL DATA:  Anxiety EXAM: CT HEAD WITHOUT CONTRAST TECHNIQUE: Contiguous axial images were obtained from the base of the skull through the vertex without intravenous contrast. RADIATION DOSE REDUCTION: This exam was performed according to the departmental dose-optimization program which includes automated exposure control, adjustment of the mA and/or kV according to patient size and/or use of iterative reconstruction technique. COMPARISON:  None Available. FINDINGS: Brain: No evidence of acute infarction, hemorrhage, hydrocephalus, extra-axial collection or mass lesion/mass effect. Vascular: No hyperdense vessel or unexpected calcification. Skull: Normal. Negative for fracture or focal lesion. Sinuses/Orbits: No acute finding. Other: None IMPRESSION: Negative head CT without contrast. Electronically Signed   By: Donavan Foil M.D.   On: 08/21/2022 18:26    Procedures Procedures    Medications Ordered in ED Medications  diazepam (VALIUM) tablet 5 mg (5 mg Oral Given 08/21/22 1943)    ED Course/ Medical Decision Making/ A&P                             Medical Decision Making Risk Prescription drug management.   This is a 57 year old male presenting to the emergency department due to body spasms, feeling "out of it" and fatigue.  Differential is broad and include electrolyte derangement, thyroid abnormality, symptomatic anemia, stress reaction, neurologic process, nutritional deficit, arrhythmia, seizure, medication reaction.  Independent history  obtained from patient's wife as well as external medical records.  Physical exam is not particularly insightful.  He does have an occasional spasm across his body but there is no tonic-clonic activity.  Based on description this has not been a seizure-like presentation given he has been conscious and not postictal.  He has no appreciable focal deficit on exam, range x 3.  He is very clearly anxious but is not having any SI or HI.  Does not have a history of alcohol abuse and given clinically not in withdrawal.   I ordered, viewed and interpreted laboratory workup.  Normal thyroid function, negative UDS and no injury on UTI.  There is no gross electrolyte derangement, AKI or anemia.  CT head is negative for  acute process.  I suspect patient's symptoms are multifactorial.  There is no obvious metabolic abnormality that be causing it, there is no significant electrolyte derangement has not had any dietary changes so it seems less likely to be a nutritional deficit.  Given the acuity started after stressful news I do think there may be a psychiatric component underlying.  He was having the spasming before the buspirone so it does not seem to be an adverse reaction and patient and wife state this has been helping so I do not want to discontinue it.  He felt improved after the Valium, I think it is reasonable to do a short course for spasming until he is able to get established psychiatry.  I offered psychiatric evaluation here in the ED the patient is working on an outpatient and does not wish to stay in he is not suicidal or homicidal Se[place] think he needs emergent TTS.  We discussed very strict return precautions, he will follow-up with his primary tomorrow or Monday for close reevaluation.        Final Clinical Impression(s) / ED Diagnoses Final diagnoses:  Spasm    Rx / DC Orders ED Discharge Orders          Ordered    diazepam (VALIUM) 5 MG tablet  2 times daily        08/21/22 2047               Sherrill Raring, PA-C 08/21/22 2138    Drenda Freeze, MD 08/21/22 [phone]

## 2022-08-22 LAB — T4, FREE: Free T4: 0.95 ng/dL (ref 0.61–1.12)
